# Patient Record
Sex: Male | Born: 1985 | Race: White | Hispanic: No | Marital: Married | Smoking: Never smoker
Health system: Southern US, Community
[De-identification: ages and names within clinical notes are randomized; demographics above are authoritative.]

## PROBLEM LIST (undated history)

## (undated) DIAGNOSIS — K8020 Calculus of gallbladder without cholecystitis without obstruction: Secondary | ICD-10-CM

## (undated) DIAGNOSIS — K219 Gastro-esophageal reflux disease without esophagitis: Secondary | ICD-10-CM

## (undated) DIAGNOSIS — R748 Abnormal levels of other serum enzymes: Secondary | ICD-10-CM

## (undated) HISTORY — PX: TONSILLECTOMY: SUR1361

## (undated) HISTORY — PX: TYMPANOSTOMY TUBE PLACEMENT: SHX32

---

## 2009-11-13 ENCOUNTER — Emergency Department (HOSPITAL_COMMUNITY): Admission: EM | Admit: 2009-11-13 | Discharge: 2009-11-13 | Payer: Self-pay | Admitting: Emergency Medicine

## 2013-12-18 HISTORY — PX: SURGERY SCROTAL / TESTICULAR: SUR1316

## 2014-10-30 ENCOUNTER — Other Ambulatory Visit: Payer: Self-pay | Admitting: Gastroenterology

## 2014-10-30 DIAGNOSIS — R109 Unspecified abdominal pain: Secondary | ICD-10-CM

## 2014-11-02 ENCOUNTER — Ambulatory Visit
Admission: RE | Admit: 2014-11-02 | Discharge: 2014-11-02 | Disposition: A | Discharge status: Home / Self Care | Payer: BC Managed Care – PPO | Source: Ambulatory Visit | Attending: Gastroenterology | Admitting: Gastroenterology

## 2014-11-02 ENCOUNTER — Ambulatory Visit (INDEPENDENT_AMBULATORY_CARE_PROVIDER_SITE_OTHER): Payer: Self-pay | Admitting: Surgery

## 2014-11-02 DIAGNOSIS — R109 Unspecified abdominal pain: Secondary | ICD-10-CM

## 2014-11-03 ENCOUNTER — Encounter (HOSPITAL_COMMUNITY)
Admission: RE | Admit: 2014-11-03 | Discharge: 2014-11-03 | Disposition: A | Discharge status: Home / Self Care | Payer: BC Managed Care – PPO | Source: Ambulatory Visit | Attending: Surgery | Admitting: Surgery

## 2014-11-03 ENCOUNTER — Inpatient Hospital Stay (HOSPITAL_COMMUNITY): Admission: RE | Admit: 2014-11-03 | Payer: BC Managed Care – PPO | Source: Ambulatory Visit

## 2014-11-03 ENCOUNTER — Encounter (HOSPITAL_COMMUNITY): Payer: Self-pay

## 2014-11-03 HISTORY — DX: Abnormal levels of other serum enzymes: R74.8

## 2014-11-03 HISTORY — DX: Gastro-esophageal reflux disease without esophagitis: K21.9

## 2014-11-03 HISTORY — DX: Calculus of gallbladder without cholecystitis without obstruction: K80.20

## 2014-11-03 LAB — COMPREHENSIVE METABOLIC PANEL
ALT: 329 U/L — AB (ref 0–53)
AST: 92 U/L — AB (ref 0–37)
Albumin: 4.5 g/dL (ref 3.5–5.2)
Alkaline Phosphatase: 161 U/L — ABNORMAL HIGH (ref 39–117)
Anion gap: 12 (ref 5–15)
BUN: 13 mg/dL (ref 6–23)
CALCIUM: 9.7 mg/dL (ref 8.4–10.5)
CO2: 27 meq/L (ref 19–32)
CREATININE: 0.95 mg/dL (ref 0.50–1.35)
Chloride: 101 mEq/L (ref 96–112)
GFR calc Af Amer: >90 mL/min (ref >90)
Glucose, Bld: 85 mg/dL (ref 70–99)
Potassium: 4.4 mEq/L (ref 3.7–5.3)
SODIUM: 140 meq/L (ref 137–147)
TOTAL PROTEIN: 7.8 g/dL (ref 6.0–8.3)
Total Bilirubin: 2.8 mg/dL — ABNORMAL HIGH (ref 0.3–1.2)

## 2014-11-03 LAB — CBC
HCT: 45.1 % (ref 39.0–52.0)
Hemoglobin: 16.4 g/dL (ref 13.0–17.0)
MCH: 34 pg (ref 26.0–34.0)
MCHC: 36.4 g/dL — AB (ref 30.0–36.0)
MCV: 93.6 fL (ref 78.0–100.0)
PLATELETS: 182 10*3/uL (ref 150–400)
RBC: 4.82 MIL/uL (ref 4.22–5.81)
RDW: 13 % (ref 11.5–15.5)
WBC: 6.2 10*3/uL (ref 4.0–10.5)

## 2014-11-03 NOTE — Patient Instructions (Addendum)
Darrol PokeBlake Lograsso  11/03/2014                           YOUR PROCEDURE IS SCHEDULED ON:  11/05/14                ENTER FROM [address] - GO TO PARKING DECK               LOOK FOR [employer]ING  / [employer]                              FOLLOW  SIGNS TO SHORT STAY CENTER                 ARRIVE AT SHORT STAY AT:   1:30 PM               CALL THIS NUMBER IF ANY PROBLEMS THE DAY OF SURGERY :               [phone]                                REMEMBER:   Do not eat food  AFTER MIDNIGHT              MAY HAVE CLEAR LIQUIDS UNTIL 9:30 AM     CLEAR LIQUID DIET   Foods Allowed                                                                     Foods Excluded  Coffee and tea, regular and decaf                             liquids that you cannot  Plain Jell-O in any flavor                                             see through such as: Fruit ices (not with fruit pulp)                                     milk, soups, orange juice  Iced Popsicles                                    All solid food Carbonated beverages, regular and diet                                    Cranberry, grape and apple juices Sports drinks like Gatorade Lightly seasoned clear broth or consume(fat free) Sugar, honey syrup                       _____________________________________________________________________  Take these medicines the morning of surgery with               A SIPS OF WATER :   OMEPRAZOLE                STOP ASPIRIN / HERBAL MEDS 5 DAYS PREOP    Do not wear jewelry, make-up   Do not wear lotions, powders, or perfumes.   Do not shave legs or underarms 12 hrs. before surgery (men may shave face)  Do not bring valuables to the hospital.  Contacts, dentures or bridgework may not be worn into surgery.  Leave suitcase in the car. After surgery it may be brought to your room.  For patients admitted to the hospital more than one night, checkout time is             11:00 AM                                                       The day of discharge.   Patients discharged the day of surgery will not be allowed to drive home.            If going home same day of surgery, must have someone stay with you              FIRST 24 hrs at home and arrange for some one to drive you              home from hospital.   ________________________________________________________________________                                                                                                  [place] - PREPARING FOR SURGERY  Before surgery, you can play an important role.  Because skin is not sterile, your skin needs to be as free of germs as possible.  You can reduce the number of germs on your skin by washing with CHG (chlorahexidine gluconate) soap before surgery.  CHG is an antiseptic cleaner which kills germs and bonds with the skin to continue killing germs even after washing. Please DO NOT use if you have an allergy to CHG or antibacterial soaps.  If your skin becomes reddened/irritated stop using the CHG and inform your nurse when you arrive at Short Stay. Do not shave (including legs and underarms) for at least 48 hours prior to the first CHG shower.  You may shave your face. Please follow these instructions carefully:   1.  Shower with CHG Soap the night before surgery and the  morning of Surgery.   2.  If you choose to wash your hair, wash your hair first as usual with your  normal  Shampoo.   3.  After you shampoo, rinse your hair and body thoroughly to remove the  shampoo.  4.  Use CHG as you would any other liquid soap.  You can apply chg directly  to the skin and wash . Gently wash with scrungie or clean wascloth    5.  Apply the CHG Soap to your body ONLY FROM THE NECK DOWN.   Do not use on open                           Wound or open sores. Avoid contact with eyes, ears mouth and genitals (private  parts).                        Genitals (private parts) with your normal soap.              6.  Wash thoroughly, paying special attention to the area where your surgery  will be performed.   7.  Thoroughly rinse your body with warm water from the neck down.   8.  DO NOT shower/wash with your normal soap after using and rinsing off  the CHG Soap .                9.  Pat yourself dry with a clean towel.             10.  Wear clean pajamas.             11.  Place clean sheets on your bed the night of your first shower and do not  sleep with pets.  Day of Surgery : Do not apply any lotions/deodorants the morning of surgery.  Please wear clean clothes to the hospital/surgery center.  FAILURE TO FOLLOW THESE INSTRUCTIONS MAY RESULT IN THE CANCELLATION OF YOUR SURGERY    PATIENT SIGNATURE_________________________________  ______________________________________________________________________

## 2014-11-03 NOTE — Progress Notes (Signed)
Quick Note:  These results are acceptable for scheduled surgery.  Brealyn Baril M. Yoshie Kosel, MD, FACS [facility] Office: [phone]   ______ 

## 2014-11-05 ENCOUNTER — Encounter (HOSPITAL_COMMUNITY)
Admission: RE | Disposition: A | Discharge status: Home / Self Care | Payer: Self-pay | Source: Ambulatory Visit | Attending: Surgery

## 2014-11-05 ENCOUNTER — Ambulatory Visit (HOSPITAL_COMMUNITY): Payer: BC Managed Care – PPO | Admitting: Anesthesiology

## 2014-11-05 ENCOUNTER — Encounter (HOSPITAL_COMMUNITY): Payer: Self-pay | Admitting: *Deleted

## 2014-11-05 ENCOUNTER — Ambulatory Visit (HOSPITAL_COMMUNITY): Payer: BC Managed Care – PPO

## 2014-11-05 ENCOUNTER — Inpatient Hospital Stay (HOSPITAL_COMMUNITY)
Admission: RE | Admit: 2014-11-05 | Discharge: 2014-11-06 | DRG: 419 | Disposition: A | Discharge status: Home / Self Care | Payer: BC Managed Care – PPO | Source: Ambulatory Visit | Attending: Surgery | Admitting: Surgery

## 2014-11-05 DIAGNOSIS — Z880 Allergy status to penicillin: Secondary | ICD-10-CM

## 2014-11-05 DIAGNOSIS — Z79899 Other long term (current) drug therapy: Secondary | ICD-10-CM

## 2014-11-05 DIAGNOSIS — K8065 Calculus of gallbladder and bile duct with chronic cholecystitis with obstruction: Principal | ICD-10-CM | POA: Diagnosis present

## 2014-11-05 DIAGNOSIS — K8021 Calculus of gallbladder without cholecystitis with obstruction: Secondary | ICD-10-CM | POA: Diagnosis present

## 2014-11-05 DIAGNOSIS — K819 Cholecystitis, unspecified: Secondary | ICD-10-CM

## 2014-11-05 DIAGNOSIS — R7989 Other specified abnormal findings of blood chemistry: Secondary | ICD-10-CM | POA: Diagnosis present

## 2014-11-05 DIAGNOSIS — K8010 Calculus of gallbladder with chronic cholecystitis without obstruction: Secondary | ICD-10-CM | POA: Diagnosis present

## 2014-11-05 DIAGNOSIS — K831 Obstruction of bile duct: Secondary | ICD-10-CM

## 2014-11-05 HISTORY — PX: CHOLECYSTECTOMY: SHX55

## 2014-11-05 SURGERY — LAPAROSCOPIC CHOLECYSTECTOMY WITH INTRAOPERATIVE CHOLANGIOGRAM
Anesthesia: General | Site: Abdomen

## 2014-11-05 MED ORDER — LACTATED RINGERS IV SOLN
INTRAVENOUS | Status: DC | PRN
  Administered 2014-11-05 (×2): via INTRAVENOUS

## 2014-11-05 MED ORDER — CISATRACURIUM BESYLATE (PF) 10 MG/5ML IV SOLN
INTRAVENOUS | Status: DC | PRN
  Administered 2014-11-05: 6 mg via INTRAVENOUS
  Administered 2014-11-05: 2 mg via INTRAVENOUS

## 2014-11-05 MED ORDER — GLYCOPYRROLATE 0.2 MG/ML IJ SOLN
INTRAMUSCULAR | Status: AC
Start: 2014-11-05 — End: 2014-11-05
  Filled 2014-11-05: qty 1

## 2014-11-05 MED ORDER — PROPOFOL 10 MG/ML IV BOLUS
INTRAVENOUS | Status: DC | PRN
  Administered 2014-11-05: 180 mg via INTRAVENOUS

## 2014-11-05 MED ORDER — LIDOCAINE HCL (CARDIAC) 20 MG/ML IV SOLN
INTRAVENOUS | Status: DC | PRN
  Administered 2014-11-05: 75 mg via INTRAVENOUS

## 2014-11-05 MED ORDER — HYDROMORPHONE HCL 1 MG/ML IJ SOLN
1.0000 mg | INTRAMUSCULAR | Status: DC | PRN
  Administered 2014-11-05 – 2014-11-06 (×6): 1 mg via INTRAVENOUS
  Filled 2014-11-05 (×6): qty 1

## 2014-11-05 MED ORDER — KCL IN DEXTROSE-NACL 20-5-0.45 MEQ/L-%-% IV SOLN
INTRAVENOUS | Status: DC
  Administered 2014-11-05: 20:00:00 via INTRAVENOUS
  Filled 2014-11-05 (×2): qty 1000

## 2014-11-05 MED ORDER — SUFENTANIL CITRATE 50 MCG/ML IV SOLN
INTRAVENOUS | Status: AC
  Filled 2014-11-05: qty 1

## 2014-11-05 MED ORDER — FENTANYL CITRATE 0.05 MG/ML IJ SOLN
INTRAMUSCULAR | Status: AC
  Filled 2014-11-05: qty 5

## 2014-11-05 MED ORDER — BUPIVACAINE HCL 0.25 % IJ SOLN
INTRAMUSCULAR | Status: DC | PRN
  Administered 2014-11-05: 22 mL

## 2014-11-05 MED ORDER — BUPIVACAINE HCL (PF) 0.25 % IJ SOLN
INTRAMUSCULAR | Status: AC
  Filled 2014-11-05: qty 30

## 2014-11-05 MED ORDER — DEXAMETHASONE SODIUM PHOSPHATE 10 MG/ML IJ SOLN
INTRAMUSCULAR | Status: DC | PRN
  Administered 2014-11-05: 10 mg via INTRAVENOUS

## 2014-11-05 MED ORDER — HYDROMORPHONE HCL 1 MG/ML IJ SOLN
0.2500 mg | INTRAMUSCULAR | Status: DC | PRN
  Administered 2014-11-05 (×2): 0.5 mg via INTRAVENOUS

## 2014-11-05 MED ORDER — DEXAMETHASONE SODIUM PHOSPHATE 10 MG/ML IJ SOLN
INTRAMUSCULAR | Status: AC
  Filled 2014-11-05: qty 1

## 2014-11-05 MED ORDER — LIDOCAINE HCL (CARDIAC) 20 MG/ML IV SOLN
INTRAVENOUS | Status: AC
  Filled 2014-11-05: qty 5

## 2014-11-05 MED ORDER — PROPOFOL 10 MG/ML IV BOLUS
INTRAVENOUS | Status: AC
  Filled 2014-11-05: qty 20

## 2014-11-05 MED ORDER — LACTATED RINGERS IV SOLN
INTRAVENOUS | Status: DC | PRN
  Administered 2014-11-05: 1000 mL via INTRAVENOUS

## 2014-11-05 MED ORDER — CIPROFLOXACIN IN D5W 400 MG/200ML IV SOLN
400.0000 mg | Freq: Once | INTRAVENOUS | Status: AC
  Administered 2014-11-05: 400 mg via INTRAVENOUS

## 2014-11-05 MED ORDER — HYDROMORPHONE HCL 1 MG/ML IJ SOLN
INTRAMUSCULAR | Status: AC
  Filled 2014-11-05: qty 1

## 2014-11-05 MED ORDER — ACETAMINOPHEN 325 MG PO TABS: 650.0000 mg | ORAL_TABLET | ORAL | Status: DC | PRN

## 2014-11-05 MED ORDER — CIPROFLOXACIN IN D5W 400 MG/200ML IV SOLN
INTRAVENOUS | Status: AC
  Filled 2014-11-05: qty 200

## 2014-11-05 MED ORDER — IOHEXOL 300 MG/ML  SOLN
INTRAMUSCULAR | Status: DC | PRN
  Administered 2014-11-05: 13 mL via INTRAVENOUS

## 2014-11-05 MED ORDER — FENTANYL CITRATE 0.05 MG/ML IJ SOLN
INTRAMUSCULAR | Status: DC | PRN
  Administered 2014-11-05 (×5): 50 ug via INTRAVENOUS

## 2014-11-05 MED ORDER — ONDANSETRON HCL 4 MG PO TABS
4.0000 mg | ORAL_TABLET | Freq: Four times a day (QID) | ORAL | Status: DC | PRN
Start: 2014-11-05 — End: 2014-11-06

## 2014-11-05 MED ORDER — LACTATED RINGERS IV SOLN: INTRAVENOUS | Status: DC

## 2014-11-05 MED ORDER — NEOSTIGMINE METHYLSULFATE 10 MG/10ML IV SOLN
INTRAVENOUS | Status: DC | PRN
  Administered 2014-11-05: 3 mg via INTRAVENOUS

## 2014-11-05 MED ORDER — ONDANSETRON HCL 4 MG/2ML IJ SOLN
INTRAMUSCULAR | Status: AC
  Filled 2014-11-05: qty 2

## 2014-11-05 MED ORDER — ONDANSETRON HCL 4 MG/2ML IJ SOLN
INTRAMUSCULAR | Status: DC | PRN
  Administered 2014-11-05: 4 mg via INTRAVENOUS

## 2014-11-05 MED ORDER — MIDAZOLAM HCL 5 MG/5ML IJ SOLN
INTRAMUSCULAR | Status: DC | PRN
  Administered 2014-11-05 (×2): 1 mg via INTRAVENOUS

## 2014-11-05 MED ORDER — CEFAZOLIN SODIUM-DEXTROSE 2-3 GM-% IV SOLR: 2.0000 g | INTRAVENOUS | Status: DC

## 2014-11-05 MED ORDER — HYDROCODONE-ACETAMINOPHEN 5-325 MG PO TABS
1.0000 | ORAL_TABLET | ORAL | Status: DC | PRN
  Administered 2014-11-06 (×2): 2 via ORAL
  Filled 2014-11-05 (×2): qty 2

## 2014-11-05 MED ORDER — SUCCINYLCHOLINE CHLORIDE 20 MG/ML IJ SOLN
INTRAMUSCULAR | Status: DC | PRN
  Administered 2014-11-05: 140 mg via INTRAVENOUS

## 2014-11-05 MED ORDER — ONDANSETRON HCL 4 MG/2ML IJ SOLN: 4.0000 mg | Freq: Four times a day (QID) | INTRAMUSCULAR | Status: DC | PRN

## 2014-11-05 MED ORDER — MIDAZOLAM HCL 2 MG/2ML IJ SOLN
INTRAMUSCULAR | Status: AC
  Filled 2014-11-05: qty 2

## 2014-11-05 MED ORDER — ROCURONIUM BROMIDE 100 MG/10ML IV SOLN
INTRAVENOUS | Status: AC
  Filled 2014-11-05: qty 1

## 2014-11-05 MED ORDER — GLYCOPYRROLATE 0.2 MG/ML IJ SOLN
INTRAMUSCULAR | Status: DC | PRN
  Administered 2014-11-05: 0.6 mg via INTRAVENOUS

## 2014-11-05 SURGICAL SUPPLY — 37 items
APPLIER CLIP ROT 10 11.4 M/L (STAPLE) ×3
BENZOIN TINCTURE PRP APPL 2/3 (GAUZE/BANDAGES/DRESSINGS) ×3 IMPLANT
CABLE HIGH FREQUENCY MONO STRZ (ELECTRODE) ×3 IMPLANT
CANISTER SUCTION 2500CC (MISCELLANEOUS) ×3 IMPLANT
CHLORAPREP W/TINT 26ML (MISCELLANEOUS) ×3 IMPLANT
CLIP APPLIE ROT 10 11.4 M/L (STAPLE) ×1 IMPLANT
CLOSURE WOUND 1/2 X4 (GAUZE/BANDAGES/DRESSINGS) ×1
COVER MAYO STAND STRL (DRAPES) ×3 IMPLANT
DECANTER SPIKE VIAL GLASS SM (MISCELLANEOUS) ×6 IMPLANT
DRAPE C-ARM 42X120 X-RAY (DRAPES) ×3 IMPLANT
DRAPE LAPAROSCOPIC ABDOMINAL (DRAPES) ×3 IMPLANT
DRAPE UTILITY XL STRL (DRAPES) ×3 IMPLANT
ELECT REM PT RETURN 9FT ADLT (ELECTROSURGICAL) ×3
ELECTRODE REM PT RTRN 9FT ADLT (ELECTROSURGICAL) ×1 IMPLANT
GAUZE SPONGE 2X2 8PLY STRL LF (GAUZE/BANDAGES/DRESSINGS) ×1 IMPLANT
GLOVE SURG ORTHO 8.0 STRL STRW (GLOVE) ×3 IMPLANT
GOWN STRL REUS W/TWL XL LVL3 (GOWN DISPOSABLE) ×9 IMPLANT
HEMOSTAT SURGICEL 4X8 (HEMOSTASIS) IMPLANT
KIT BASIN OR (CUSTOM PROCEDURE TRAY) ×3 IMPLANT
NS IRRIG 1000ML POUR BTL (IV SOLUTION) ×3 IMPLANT
POUCH SPECIMEN RETRIEVAL 10MM (ENDOMECHANICALS) ×3 IMPLANT
SCISSORS LAP 5X35 DISP (ENDOMECHANICALS) ×3 IMPLANT
SET CHOLANGIOGRAPH MIX (MISCELLANEOUS) ×3 IMPLANT
SET IRRIG TUBING LAPAROSCOPIC (IRRIGATION / IRRIGATOR) ×3 IMPLANT
SLEEVE XCEL OPT CAN 5 100 (ENDOMECHANICALS) ×3 IMPLANT
SOLUTION ANTI FOG 6CC (MISCELLANEOUS) ×3 IMPLANT
SPONGE GAUZE 2X2 STER 10/PKG (GAUZE/BANDAGES/DRESSINGS) ×2
STRIP CLOSURE SKIN 1/2X4 (GAUZE/BANDAGES/DRESSINGS) ×2 IMPLANT
SUT MNCRL AB 4-0 PS2 18 (SUTURE) ×3 IMPLANT
TAPE CLOTH SURG 4X10 WHT LF (GAUZE/BANDAGES/DRESSINGS) ×3 IMPLANT
TOWEL OR 17X26 10 PK STRL BLUE (TOWEL DISPOSABLE) ×3 IMPLANT
TOWEL OR NON WOVEN STRL DISP B (DISPOSABLE) ×3 IMPLANT
TRAY LAPAROSCOPIC (CUSTOM PROCEDURE TRAY) ×3 IMPLANT
TROCAR BLADELESS OPT 5 100 (ENDOMECHANICALS) ×3 IMPLANT
TROCAR XCEL BLUNT TIP 100MML (ENDOMECHANICALS) ×3 IMPLANT
TROCAR XCEL NON-BLD 11X100MML (ENDOMECHANICALS) ×3 IMPLANT
TUBING INSUFFLATION 10FT LAP (TUBING) ×3 IMPLANT

## 2014-11-05 NOTE — Op Note (Signed)
Procedure Note  Pre-operative Diagnosis:  Cholelithiasis, rule out choledocholithiasis  Post-operative Diagnosis:  Chronic cholecystitis, cholelithiasis  Surgeon:  Velora Hecklerodd M. Tauriel Scronce, MD, FACS  Assistant:  none   Procedure:  Laparoscopic cholecystectomy with intra-operative cholangiography  Anesthesia:  General  Estimated Blood Loss:  minimal  Drains: none         Specimen: Gallbladder to pathology  Indications:  28 yo WM with abdominal pain, USN demonstrates gallstones and sludge, abnormal LFT's.  Now for lap chole with IOC.  Procedure Details:  The patient was seen in the pre-op holding area. The risks, benefits, complications, treatment options, and expected outcomes were previously discussed with the patient. The patient agreed with the proposed plan and has signed the informed consent form.  The patient was brought to the Operating Room, identified as Christopher Saunders and the procedure verified as laparoscopic cholecystectomy with intraoperative cholangiography. A "time out" was completed and the above information confirmed.  The patient was placed in the supine position. Following induction of general anesthesia, the abdomen was prepped and draped in the usual aseptic fashion.  An incision was made in the skin near the umbilicus. The midline fascia was incised and the peritoneal cavity was entered and a Hasson canula was introduced under direct vision.  The Hasson canula was secured with a 0-Vicryl pursestring suture. Pneumoperitoneum was established with carbon dioxide. Additional trocars were introduced under direct vision along the right costal margin in the midline, mid-clavicular line, and anterior axillary line.   The gallbladder was identified and the fundus grasped and retracted cephalad. Adhesions were taken down bluntly and the electrocautery was utilized as needed, taking care not to injure any adjacent structures. The infundibulum was grasped and retracted laterally,  exposing the peritoneum overlying the triangle of Calot. The peritoneum was incised and structures exposed with blunt dissection. The cystic duct was clearly identified, bluntly dissected circumferentially, and clipped at the neck of the gallbladder.  An incision was made in the cystic duct and the cholangiogram catheter introduced. The catheter was secured using an ligaclip.  Real-time cholangiography was performed using C-arm fluoroscopy.  There was rapid filling of a normal caliber common bile duct.  There was reflux of contrast into the left and right hepatic ductal systems.  There was free flow distally into the duodenum without filling defect or obstruction.  Cholangiogram was reviewed by the radiologist who did not identify any filling defects in the CBD.  The catheter was removed from the peritoneal cavity.  The cystic duct was then ligated with surgical clips and divided. The cystic artery was identified, dissected circumferentially, ligated with ligaclips, and divided.  The gallbladder was dissected away from the liver bed using the electrocautery for hemostasis. The gallbladder was completely removed from the liver and placed into an endocatch bag. The gallbladder was removed in the endocatch bag through the umbilical port site and submitted to pathology for review.  The right upper quadrant was irrigated and the gallbladder bed was inspected. Hemostasis was achieved with the electrocautery.  Pneumoperitoneum was released after viewing removal of the trocars with good hemostasis noted. The umbilical wound was irrigated and the fascia was then closed with the pursestring suture.  Local anesthetic was infiltrated at all port sites. The skin incisions were closed with 4-0 Monocril subcuticular sutures and steri-strips and dressings were applied.  Instrument, sponge, and needle counts were correct at the conclusion of the case.  The patient was awakened from anesthesia and brought to the recovery  room in  stable condition.  The patient tolerated the procedure well.   Velora Hecklerodd M. Damontae Loppnow, MD, [facility] Office: [phone]

## 2014-11-05 NOTE — Anesthesia Procedure Notes (Signed)
Procedure Name: Intubation Date/Time: 11/05/2014 4:49 PM Performed by: Edison PaceGRAY, Chantale Leugers E Pre-anesthesia Checklist: Patient identified, Emergency Drugs available, Suction available, Patient being monitored and Timeout performed Patient Re-evaluated:Patient Re-evaluated prior to inductionOxygen Delivery Method: Circle system utilized Preoxygenation: Pre-oxygenation with 100% oxygen Intubation Type: IV induction and Cricoid Pressure applied Ventilation: Mask ventilation without difficulty Laryngoscope Size: Mac and 4 Grade View: Grade II Tube type: Oral Tube size: 7.5 mm Number of attempts: 1 Airway Equipment and Method: Stylet Placement Confirmation: ETT inserted through vocal cords under direct vision,  positive ETCO2 and breath sounds checked- equal and bilateral Secured at: 21 cm Tube secured with: Tape Dental Injury: Teeth and Oropharynx as per pre-operative assessment

## 2014-11-05 NOTE — Anesthesia Postprocedure Evaluation (Signed)
  Anesthesia Post-op Note  Patient: Christopher Saunders  Procedure(s) Performed: Procedure(s) (LRB): LAPAROSCOPIC CHOLECYSTECTOMY WITH INTRAOPERATIVE CHOLANGIOGRAM (N/A)  Patient Location: PACU  Anesthesia Type: General  Level of Consciousness: awake and alert   Airway and Oxygen Therapy: Patient Spontanous Breathing  Post-op Pain: mild  Post-op Assessment: Post-op Vital signs reviewed, Patient's Cardiovascular Status Stable, Respiratory Function Stable, Patent Airway and No signs of Nausea or vomiting  Last Vitals:  Filed Vitals:   11/05/14 1840  BP:   Pulse: 75  Temp:   Resp: 11    Post-op Vital Signs: stable   Complications: No apparent anesthesia complications

## 2014-11-05 NOTE — Anesthesia Preprocedure Evaluation (Addendum)
Anesthesia Evaluation  Patient identified by MRN, date of birth, ID band Patient awake    Reviewed: Allergy & Precautions, H&P , NPO status , Patient's Chart, lab work & pertinent test results  Airway Mallampati: II  TM Distance: >3 FB Neck ROM: full    Dental no notable dental hx. (+) Teeth Intact, Dental Advisory Given   Pulmonary neg pulmonary ROS,  breath sounds clear to auscultation  Pulmonary exam normal       Cardiovascular Exercise Tolerance: Good negative cardio ROS  Rhythm:regular Rate:Normal     Neuro/Psych negative neurological ROS  negative psych ROS   GI/Hepatic negative GI ROS, Neg liver ROS,   Endo/Other  negative endocrine ROS  Renal/GU negative Renal ROS  negative genitourinary   Musculoskeletal   Abdominal   Peds  Hematology negative hematology ROS (+)   Anesthesia Other Findings   Reproductive/Obstetrics negative OB ROS                             Anesthesia Physical Anesthesia Plan  ASA: I  Anesthesia Plan: General   Post-op Pain Management:    Induction:   Airway Management Planned: Oral ETT  Additional Equipment:   Intra-op Plan:   Post-operative Plan: Extubation in OR  Informed Consent: I have reviewed the patients History and Physical, chart, labs and discussed the procedure including the risks, benefits and alternatives for the proposed anesthesia with the patient or authorized representative who has indicated his/her understanding and acceptance.   Dental Advisory Given  Plan Discussed with: CRNA and Surgeon  Anesthesia Plan Comments:        Anesthesia Quick Evaluation

## 2014-11-05 NOTE — Progress Notes (Signed)
Received phone call from Dr Gerrit FriendsGerkin, T to enter NPO order and to inform patient that Dr Ancil LinseyMega will be in tomorrow morning at 0730 to see patient.

## 2014-11-05 NOTE — H&P (Signed)
General Surgery [facility]  Darrol PokeBlake Scicchitano 11/02/2014 4:26 PM Location: [facility] Patient #: [id number] DOB: 04-Mar-1986 Married / Language: [place] / Race: White Male  History of Present Illness Velora Heckler(Nayeli Calvert M. Madalee Altmann MD; 11/02/2014 5:01 PM) The patient is a 28 year old male who presents for evaluation of gall stones. Patient is referred by Dr. Vida RiggerMarc Magod for evaluation of cholelithiasis and suspected choledocholithiasis. Patient experienced his first episode of epigastric abdominal pain radiating to the back approximately 6 weeks ago. He has had 3 episodes included an episode last week. Patient was evaluated by his gastroenterologist and found to have markedly abnormal liver function test with a total bilirubin of 13.6 and elevated transaminases. Lipase level was normal. Ultrasound demonstrated sludge and multiple small gallstones. Common bile duct was slightly dilated at 7 mm. Patient improved symptomatically. Repeat laboratory studies performed today show a total bilirubin of 2.6, and AST of 86, and ALT of 338, and an alkaline phosphatase of 148. Patient presents today for evaluation. He is having minimal epigastric abdominal discomfort. He has been tolerating a light diet. He denies fevers or chills. Patient has had no prior history of hepatobiliary or pancreatic disease. He has had no prior abdominal surgery. There is no family history of cholelithiasis.   Other Problems Gilmer Mor(Sonya Bynum, CMA; 11/02/2014 4:27 PM) Cholelithiasis  Diagnostic Studies History Gilmer Mor(Sonya Bynum, CMA; 11/02/2014 4:27 PM) Colonoscopy never  Allergies (Sonya Bynum, CMA; 11/02/2014 4:27 PM) Amoxicillin *PENICILLINS*  Medication History (Sonya Bynum, CMA; 11/02/2014 4:27 PM) Omeprazole (Oral) Specific dose unknown - Active. Singulair (Oral) Specific dose unknown - Active.  Social History Gilmer Mor(Sonya Bynum, CMA; 11/02/2014 4:27 PM) Alcohol use Occasional alcohol use. Caffeine  use Coffee. No drug use Tobacco use Never smoker.  Family History Gilmer Mor(Sonya Bynum, CMA; 11/02/2014 4:27 PM) Alcohol Abuse Mother.  Review of Systems Lamar Laundry(Sonya Bynum CMA; 11/02/2014 4:27 PM) General Not Present- Appetite Loss, Chills, Fatigue, Fever, Night Sweats, Weight Gain and Weight Loss. Skin Not Present- Change in Wart/Mole, Dryness, Hives, Jaundice, New Lesions, Non-Healing Wounds, Rash and Ulcer. HEENT Not Present- Earache, Hearing Loss, Hoarseness, Nose Bleed, Oral Ulcers, Ringing in the Ears, Seasonal Allergies, Sinus Pain, Sore Throat, Visual Disturbances, Wears glasses/contact lenses and Yellow Eyes. Respiratory Not Present- Bloody sputum, Chronic Cough, Difficulty Breathing, Snoring and Wheezing. Breast Not Present- Breast Mass, Breast Pain, Nipple Discharge and Skin Changes. Cardiovascular Not Present- Chest Pain, Difficulty Breathing Lying Down, Leg Cramps, Palpitations, Rapid Heart Rate, Shortness of Breath and Swelling of Extremities. Gastrointestinal Not Present- Abdominal Pain, Bloating, Bloody Stool, Change in Bowel Habits, Chronic diarrhea, Constipation, Difficulty Swallowing, Excessive gas, Gets full quickly at meals, Hemorrhoids, Indigestion, Nausea, Rectal Pain and Vomiting. Male Genitourinary Not Present- Blood in Urine, Change in Urinary Stream, Frequency, Impotence, Nocturia, Painful Urination, Urgency and Urine Leakage. Musculoskeletal Not Present- Back Pain, Joint Pain, Joint Stiffness, Muscle Pain, Muscle Weakness and Swelling of Extremities. Neurological Not Present- Decreased Memory, Fainting, Headaches, Numbness, Seizures, Tingling, Tremor, Trouble walking and Weakness. Psychiatric Not Present- Anxiety, Bipolar, Change in Sleep Pattern, Depression, Fearful and Frequent crying. Endocrine Not Present- Cold Intolerance, Excessive Hunger, Hair Changes, Heat Intolerance, Hot flashes and New Diabetes. Hematology Not Present- Easy Bruising, Excessive bleeding, Gland  problems, HIV and Persistent Infections.   Vitals (Sonya Bynum CMA; 11/02/2014 4:28 PM) 11/02/2014 4:27 PM Weight: 189 lb Height: 67in Body Surface Area: 2.01 m Body Mass Index: 29.6 kg/m Pulse: 81 (Regular)  BP: 124/70 (Sitting, Left Arm, Standard)    Physical Exam Velora Heckler(Lorien Shingler M. Ahsha Hinsley  MD; 11/02/2014 5:01 PM) The physical exam findings are as follows: Note:General - appears comfortable, no distress; not diaphorectic  HEENT - normocephalic; sclerae clear, gaze conjugate; mucous membranes moist, dentition good; voice normal  Neck - symmetric on extension; no palpable anterior or posterior cervical adenopathy; no palpable masses in the thyroid bed  Chest - clear bilaterally with rhonchi, rales, or wheeze  Cor - regular rhythm with normal rate; no significant murmur  Abd - soft without distension, mild epigastric tenderness  Ext - non-tender without significant edema or lymphedema  Neuro - grossly intact; no tremor    Assessment & Plan Velora Heckler(Anmarie Fukushima M. Shell Yandow MD; 11/02/2014 5:07 PM) CHOLELITHIASIS WITH CHOLEDOCHOLITHIASIS (574.90  K80.70) Current Plans  Instructed to keep follow-up appointment as scheduled   I discussed the above findings at length with the patient and his mother. I provided him with written literature on gallbladder surgery to review at home. I discussed his case by telephone with Dr. Vida RiggerMarc Magod, his gastroenterologist.  The patient has symptomatic cholelithiasis and has had at least one episode of choledocholithiasis. His laboratory studies are currently improving but he may have retained common bile duct stones. We discussed options for management including obtaining an MRCP scan, proceeding with ERCP, or proceeding with laparoscopic cholecystectomy with intraoperative cholangiography.  I recommended proceeding with laparoscopic cholecystectomy with intraoperative cholangiography. We have discussed the risk and benefits of the procedure. We have discussed  the potential for conversion to open surgery. We have discussed the hospital stay and the postoperative recovery to be anticipated. The patient understands and wishes to proceed with surgery as soon as possible.  If there are retained common bile duct stones on intraoperative cholangiogram, the patient will require ERCP. I will coordinate this with his gastroenterologist.  Patient will be scheduled for surgery later this week.  The risks and benefits of the procedure have been discussed at length with the patient. The patient understands the proposed procedure, potential alternative treatments, and the course of recovery to be expected. All of the patient's questions have been answered at this time. The patient wishes to proceed with surgery.  Velora Hecklerodd M. Mischa Pollard, MD, [facility] Office: [phone]

## 2014-11-05 NOTE — Transfer of Care (Signed)
Immediate Anesthesia Transfer of Care Note  Patient: Christopher Saunders  Procedure(s) Performed: Procedure(s): LAPAROSCOPIC CHOLECYSTECTOMY WITH INTRAOPERATIVE CHOLANGIOGRAM (N/A)  Patient Location: PACU  Anesthesia Type:General  Level of Consciousness: awake, alert , oriented and patient cooperative  Airway & Oxygen Therapy: Patient Spontanous Breathing and Patient connected to face mask oxygen  Post-op Assessment: Report given to PACU RN, Post -op Vital signs reviewed and stable and Patient moving all extremities  Post vital signs: Reviewed and stable  Complications: No apparent anesthesia complications

## 2014-11-06 ENCOUNTER — Other Ambulatory Visit: Payer: Self-pay

## 2014-11-06 ENCOUNTER — Ambulatory Visit (HOSPITAL_COMMUNITY)
Admission: RE | Admit: 2014-11-06 | Payer: BC Managed Care – PPO | Source: Ambulatory Visit | Admitting: Gastroenterology

## 2014-11-06 ENCOUNTER — Inpatient Hospital Stay (HOSPITAL_COMMUNITY): Payer: BC Managed Care – PPO

## 2014-11-06 ENCOUNTER — Observation Stay (HOSPITAL_COMMUNITY): Payer: BC Managed Care – PPO | Admitting: Certified Registered"

## 2014-11-06 ENCOUNTER — Encounter (HOSPITAL_COMMUNITY)
Admission: RE | Disposition: A | Discharge status: Home / Self Care | Payer: Self-pay | Source: Ambulatory Visit | Attending: Surgery

## 2014-11-06 ENCOUNTER — Encounter (HOSPITAL_COMMUNITY): Payer: Self-pay | Admitting: Gastroenterology

## 2014-11-06 DIAGNOSIS — K8065 Calculus of gallbladder and bile duct with chronic cholecystitis with obstruction: Secondary | ICD-10-CM | POA: Diagnosis present

## 2014-11-06 DIAGNOSIS — Z880 Allergy status to penicillin: Secondary | ICD-10-CM | POA: Diagnosis not present

## 2014-11-06 DIAGNOSIS — Z79899 Other long term (current) drug therapy: Secondary | ICD-10-CM | POA: Diagnosis not present

## 2014-11-06 DIAGNOSIS — R7989 Other specified abnormal findings of blood chemistry: Secondary | ICD-10-CM | POA: Diagnosis present

## 2014-11-06 DIAGNOSIS — R1013 Epigastric pain: Secondary | ICD-10-CM | POA: Diagnosis present

## 2014-11-06 HISTORY — PX: ERCP: SHX5425

## 2014-11-06 LAB — COMPREHENSIVE METABOLIC PANEL
ALT: 284 U/L — ABNORMAL HIGH (ref 0–53)
AST: 115 U/L — AB (ref 0–37)
Albumin: 4.1 g/dL (ref 3.5–5.2)
Alkaline Phosphatase: 126 U/L — ABNORMAL HIGH (ref 39–117)
Anion gap: 13 (ref 5–15)
BILIRUBIN TOTAL: 3.2 mg/dL — AB (ref 0.3–1.2)
BUN: 11 mg/dL (ref 6–23)
CHLORIDE: 98 meq/L (ref 96–112)
CO2: 25 meq/L (ref 19–32)
CREATININE: 0.82 mg/dL (ref 0.50–1.35)
Calcium: 9.2 mg/dL (ref 8.4–10.5)
GFR calc Af Amer: >90 mL/min (ref >90)
Glucose, Bld: 194 mg/dL — ABNORMAL HIGH (ref 70–99)
Potassium: 4.3 mEq/L (ref 3.7–5.3)
Sodium: 136 mEq/L — ABNORMAL LOW (ref 137–147)
Total Protein: 6.7 g/dL (ref 6.0–8.3)

## 2014-11-06 SURGERY — ERCP, WITH INTERVENTION IF INDICATED
Anesthesia: General

## 2014-11-06 MED ORDER — ONDANSETRON HCL 4 MG/2ML IJ SOLN
INTRAMUSCULAR | Status: DC | PRN
  Administered 2014-11-06: 4 mg via INTRAVENOUS

## 2014-11-06 MED ORDER — PROPOFOL 10 MG/ML IV BOLUS
INTRAVENOUS | Status: AC
  Filled 2014-11-06: qty 20

## 2014-11-06 MED ORDER — CETYLPYRIDINIUM CHLORIDE 0.05 % MT LIQD: 7.0000 mL | OROMUCOSAL | Status: DC | PRN

## 2014-11-06 MED ORDER — FENTANYL CITRATE 0.05 MG/ML IJ SOLN
INTRAMUSCULAR | Status: DC | PRN
  Administered 2014-11-06 (×2): 50 ug via INTRAVENOUS

## 2014-11-06 MED ORDER — HYDROCODONE-ACETAMINOPHEN 5-325 MG PO TABS: 1.0000 | ORAL_TABLET | ORAL | Status: AC | PRN

## 2014-11-06 MED ORDER — GLUCAGON HCL RDNA (DIAGNOSTIC) 1 MG IJ SOLR
INTRAMUSCULAR | Status: AC
  Filled 2014-11-06: qty 2

## 2014-11-06 MED ORDER — SODIUM CHLORIDE 0.9 % IV SOLN: INTRAVENOUS | Status: DC

## 2014-11-06 MED ORDER — ONDANSETRON HCL 4 MG/2ML IJ SOLN
INTRAMUSCULAR | Status: AC
Start: 2014-11-06 — End: 2014-11-06
  Filled 2014-11-06: qty 2

## 2014-11-06 MED ORDER — DEXAMETHASONE SODIUM PHOSPHATE 10 MG/ML IJ SOLN
INTRAMUSCULAR | Status: AC
  Filled 2014-11-06: qty 1

## 2014-11-06 MED ORDER — MIDAZOLAM HCL 2 MG/2ML IJ SOLN
INTRAMUSCULAR | Status: AC
  Filled 2014-11-06: qty 2

## 2014-11-06 MED ORDER — CIPROFLOXACIN IN D5W 400 MG/200ML IV SOLN
INTRAVENOUS | Status: AC
  Filled 2014-11-06: qty 200

## 2014-11-06 MED ORDER — FENTANYL CITRATE 0.05 MG/ML IJ SOLN
INTRAMUSCULAR | Status: AC
  Filled 2014-11-06: qty 2

## 2014-11-06 MED ORDER — SUCCINYLCHOLINE CHLORIDE 20 MG/ML IJ SOLN
INTRAMUSCULAR | Status: DC | PRN
  Administered 2014-11-06: 100 mg via INTRAVENOUS

## 2014-11-06 MED ORDER — DEXAMETHASONE SODIUM PHOSPHATE 10 MG/ML IJ SOLN
INTRAMUSCULAR | Status: DC | PRN
  Administered 2014-11-06: 10 mg via INTRAVENOUS

## 2014-11-06 MED ORDER — CIPROFLOXACIN IN D5W 400 MG/200ML IV SOLN
INTRAVENOUS | Status: DC | PRN
  Administered 2014-11-06: 400 mg via INTRAVENOUS

## 2014-11-06 MED ORDER — MIDAZOLAM HCL 5 MG/5ML IJ SOLN
INTRAMUSCULAR | Status: DC | PRN
  Administered 2014-11-06: 2 mg via INTRAVENOUS

## 2014-11-06 MED ORDER — LACTATED RINGERS IV SOLN
INTRAVENOUS | Status: DC | PRN
  Administered 2014-11-06: 12:00:00 via INTRAVENOUS

## 2014-11-06 MED ORDER — SODIUM CHLORIDE 0.9 % IV SOLN
INTRAVENOUS | Status: DC | PRN
  Administered 2014-11-06: 20 mL

## 2014-11-06 MED ORDER — PROPOFOL 10 MG/ML IV BOLUS
INTRAVENOUS | Status: DC | PRN
  Administered 2014-11-06: 150 mg via INTRAVENOUS

## 2014-11-06 NOTE — Anesthesia Preprocedure Evaluation (Signed)

## 2014-11-06 NOTE — Plan of Care (Signed)
Problem: Phase I Progression Outcomes Goal: OOB as tolerated unless otherwise ordered Outcome: Progressing  Comments:  Patient walking in room and to bathroom.

## 2014-11-06 NOTE — Discharge Summary (Signed)
Physician Discharge Summary [facility]  Patient ID: Christopher Saunders MRN: [id number] DOB/AGE: [birth year] 28 y.o.  Admit date: 11/05/2014 Discharge date: 11/06/2014  Admission Diagnoses:  Chronic cholecystitis, cholelithiasis, choledocholithiasis  Discharge Diagnoses:  Principal Problem:   Cholelithiasis with biliary obstruction Active Problems:   Cholelithiasis with cholecystitis   Discharged Condition: good  Hospital Course: patient admitted for lap chole with IOC due to intermittent abdominal pain, USN showing cholelithiasis and sludge, and abnormal LFT's.  Patient underwent lap chole with IOC on 11/05/2014 without complication.  IOC was clear without obvious filling defects.  However, LFT's on morning following surgery with persistent elevation.  Consultation with Dr. Ewing SchleinMagod from gastroenterology obtained.  ERCP with sphicterotomy performed on 11/06/2014 with few small tiny stones from CBD.  Patient did well following procedure, pain controlled, and tolerated diet.  Discharged home at patient request.  Consults: GI  Treatments: surgery: lap cholecystectomy with IOC; ERCP with sphicterotomy  Discharge Exam: Blood pressure 116/53, pulse 68, temperature 99.2 F (37.3 C), temperature source Oral, resp. rate 16, height 5\' 8"  (1.727 m), weight 154 lb (69.854 kg), SpO2 99 %. HEENT - clear Neck - soft Chest - clear bilaterally Cor - RRR Abd - soft without distension; dressings intact and dry  Disposition: Home  Discharge Instructions    Diet - low sodium heart healthy    Complete by:  As directed      Discharge instructions    Complete by:  As directed   [facility]  LAPAROSCOPIC SURGERY - POST-OP INSTRUCTIONS  Always review your discharge instruction sheet given to you by the facility where your surgery was performed.  A prescription for pain medication may be given to you upon discharge.  Take your pain medication as prescribed.  If  narcotic pain medicine is not needed, then you may take acetaminophen (Tylenol) or ibuprofen (Advil) as needed.  Take your usually prescribed medications unless otherwise directed.  If you need a refill on your pain medication, please contact your pharmacy.  They will contact our office to request authorization. Prescriptions will not be filled after 5 P.M. or on weekends.  You should follow a light diet the first few days after arrival home, such as soup and crackers or toast.  Be sure to include plenty of fluids daily.  Most patients will experience some swelling and bruising in the area of the incisions.  Ice packs will help.  Swelling and bruising can take several days to resolve.   It is common to experience some constipation if taking pain medication after surgery.  Increasing fluid intake and taking a stool softener (such as Colace) will usually help or prevent this problem from occurring.  A mild laxative (Milk of Magnesia or Miralax) should be taken according to package instructions if there are no bowel movements after 48 hours.  Unless discharge instructions indicate otherwise, you may remove your bandages 24-48 hours after surgery, and you may shower at that time.  You may have steri-strips (small skin tapes) in place directly over the incision.  These strips should be left on the skin for 7-10 days.  If your surgeon used skin glue on the incision, you may shower in 24 hours.  The glue will flake off over the next 2-3 weeks.  Any sutures or staples will be removed at the office during your follow-up visit.  ACTIVITIES:  You may resume regular (light) daily activities beginning the next day-such as daily self-care, walking, climbing stairs-gradually increasing activities  as tolerated.  You may have sexual intercourse when it is comfortable.  Refrain from any heavy lifting or straining until approved by your doctor.  You may drive when you are no longer taking prescription pain medication,  you can comfortably wear a seatbelt, and you can safely maneuver your car and apply brakes.  You should see your doctor in the office for a follow-up appointment approximately 2-3 weeks after your surgery.  Make sure that you call for this appointment within a day or two after you arrive home to insure a convenient appointment time.  WHEN TO CALL YOUR DOCTOR: Fever over 101.0 Inability to urinate Continued bleeding from incision Increased pain, redness, or drainage from the incision Increasing abdominal pain  The clinic staff is available to answer your questions during regular business hours.  Please don't hesitate to call and ask to speak to one of the nurses for clinical concerns.  If you have a medical emergency, go to the nearest emergency room or call 911.  A [facility] from [facility] is always on call for the hospital.  Velora Hecklerodd M. Meloney Feld, MD, [facility] Office: [phone] Toll Free:  [phone] FAX [phone]  Web site: [email or web address]     Increase activity slowly    Complete by:  As directed      Remove dressing in 24 hours    Complete by:  As directed             Medication List    TAKE these medications        HYDROcodone-acetaminophen 5-325 MG per tablet  Commonly known as:  NORCO/VICODIN  Take 1-2 tablets by mouth every 4 (four) hours as needed for moderate pain.     ibuprofen 200 MG tablet  Commonly known as:  ADVIL,MOTRIN  Take 400 mg by mouth every 6 (six) hours as needed for mild pain.     omeprazole 20 MG tablet  Commonly known as:  PRILOSEC OTC  Take 20 mg by mouth daily.           Follow-up Information    Follow up with Velora HecklerGERKIN,Azizah Lisle M, MD. Schedule an appointment as soon as possible for a visit in 3 weeks.   Specialty:  General Surgery   Why:  For wound re-check   Contact information:   [address] [place] [address] [phone]       Velora Hecklerodd M. Keiosha Cancro, MD,  [facility] Office: [phone]   Signed: Velora HecklerGERKIN,Darrly Loberg M 11/06/2014, 8:32 PM

## 2014-11-06 NOTE — Op Note (Signed)
[facility] [address] [place], [id number]   ERCP PROCEDURE REPORT  PATIENT: Christopher Saunders, [name]  MR# :[id number] BIRTHDATE: Mar 11, 1986  GENDER: male ENDOSCOPIST: Vida RiggerMarc Nazaire Cordial, MD REFERRED BY: Darnell Levelodd Gerkin, M.D. PROCEDURE DATE:  11/06/2014 PROCEDURE:   ERCP with sphincterotomy/papillotomy and ERCP with removal of calculus/calculi  with balloon pull-through ASA CLASS:    1 INDICATIONS: possible CBD stonesMEDICATIONS:    general anesthesia TOPICAL ANESTHETIC:  none  DESCRIPTION OF PROCEDURE:   After the risks benefits and alternatives of the procedure were thoroughly explained, informed consent was obtained.  The Pentax ERCP [id number]  endoscope was introduced through the mouth and advanced to the second portion of the duodenum .a normal-appearing ampulla was brought into view and on the first attempt using the triple lumen sphincterotome loaded with the JAG Jagwire deep selective cannulation was obtained and the wire was advanced into the intrahepatics and dye was injected and no obvious stone was seen on initial cholangiogram but because we thought he had tiny sand-like stones went ahead and proceeded with a sphincterotomy in the customary fashion until we had adequate biliary drainage and could get the fully bowed sphincterotome easily in and out of the duct and then we proceeded with multiple 12 mm balloon pull-through's using the adjustable balloon and there was minimal resistance on the first pull-through but not much on subsequent ones and a few tiny flecks of stone material was seen and we then proceeded with an occlusion cholangiogram which was normal and the balloon was pulled one more time through the sphincterotomy site and there was adequate biliary drainage and the patient tolerated the procedure well there was no obvious immediate complication and no pancreatic duct injection or wire advancement throughout the  procedure            COMPLICATIONS: none ENDOSCOPIC IMPRESSION:1. Normal ampulla 2. Normal CBD status post sphincterotomy and balloon pull-through with only a few flecks of stone debris were removed 3. Adequate biliary drainage at the end of the procedure 4. No pancreatic duct wire advancement or injection throughout the procedure  RECOMMENDATIONS:customary post-ERCP orders if tolerates clear liquids at 6 PM may go home at 7 PM if doing well and his discharge instructions were discussed including call me or Dr. Gerrit Friendsgerkin this weekend and call us if needed sooner otherwise repeat liver tests when  he has surgical follow-up in a few weeks    _______________________________ eSigned:  Vida RiggerMarc Amarrion Pastorino, MD 11/06/2014 1:52 PM   QI:ONGECC:Todd Gerrit FriendsGerkin, MD

## 2014-11-06 NOTE — Plan of Care (Signed)
Problem: Phase I Progression Outcomes Goal: Voiding-avoid urinary catheter unless indicated Outcome: Completed/Met Date Met:  11/06/14

## 2014-11-06 NOTE — Progress Notes (Signed)
Patient ID: Christopher Saunders, male   DOB: November 29, 1986, 28 y.o.   MRN: [id number]  [facility]  POD# 1  Subjective: Patient back in room after ERCP today by Dr. Ewing SchleinMagod.  Discussed lab work earlier today.  Discussed ERCP decision with Dr. Ewing SchleinMagod this AM.  Patient doing well.  Wife at bedside.  Hoping to go home later today after eating.  Objective: Vital signs in last 24 hours: Temp:  [97.8 F (36.6 C)-99.2 F (37.3 C)] 99.2 F (37.3 C) (11/20 0528) Pulse Rate:  [64-79] 68 (11/20 1415) Resp:  [11-18] 16 (11/20 1415) BP: (114-140)/(53-94) 116/53 mmHg (11/20 1410) SpO2:  [97 %-100 %] 99 % (11/20 1415) Last BM Date:  (prior to admission)  Intake/Output from previous day: 11/19 0701 - 11/20 0700 In: 1757.5 [I.V.:1757.5] Out: 2625 [Urine:2625]  Exam: HEENT - clear, not icteric Neck - soft Abd - dressings dry and intact; no distension Ext - no significant edema Neuro - grossly intact, no focal deficits  Lab Results:  No results for input(s): WBC, HGB, HCT, PLT in the last 72 hours.   Recent Labs  11/06/14 0521  NA 136*  K 4.3  CL 98  CO2 25  GLUCOSE 194*  BUN 11  CREATININE 0.82  CALCIUM 9.2    Studies/Results: Dg Cholangiogram Operative  11/05/2014   CLINICAL DATA:  Intraoperative cholangiogram, history of possible recently passed common bile duct stones  EXAM: INTRAOPERATIVE CHOLANGIOGRAM  TECHNIQUE: Cholangiographic images from the C-arm fluoroscopic device were submitted for interpretation post-operatively. Please see the procedural report for the amount of contrast and the fluoroscopy time utilized.  COMPARISON:  11/02/2014  FINDINGS: Free flow contrast material is noted into the common bile duct opacification of the intrahepatic and extrahepatic biliary tree is noted. No filling defects are identified. Free flow contrast into the duodenum is noted.  FLUOROSCOPY TIME:  21 seconds  IMPRESSION: No evidence of retained  common bile duct stone.  These results were called by telephone at the time of interpretation on 11/05/2014 at 5:58 pm to Dr. Darnell LevelDD Keyion Knack , who verbally acknowledged these results.   Electronically Signed   By: Alcide CleverMark  Lukens M.D.   On: 11/05/2014 17:58   Dg Ercp Biliary & Pancreatic Ducts  11/06/2014   CLINICAL DATA:  28 year old male with a history of cholelithiasis and concern for small stones and sludge in the common bile duct.  EXAM: ERCP sphincterotomy and balloon sweep of the common bile duct  TECHNIQUE: Multiple spot images obtained with the fluoroscopic device and submitted for interpretation post-procedure.  COMPARISON:  Intraoperative cholangiogram 11/05/2014; abdominal ultrasound 11/02/2014  FINDINGS: A total of 3 spot images and a single cine loop were obtained during ERCP procedure. Initial images demonstrate a flexible endoscope in the duodenum with wire cannulation into the intrahepatic biliary tree. Subsequently, sphincterotomy was performed followed by balloon sweep. Air bubbles are identified within the distal common bile duct. No definite stone visualized. However, the GI operative report describes several small flecks of stone were successfully removed. No evidence of complication.  IMPRESSION: ERCP and sphincterotomy and balloon sweep of the common bile duct as above.  These images were submitted for radiologic interpretation only. Please see the procedural report for the amount of contrast and the fluoroscopy time utilized.   Electronically Signed   By: Malachy MoanHeath  McCullough M.D.   On: 11/06/2014 14:54    Assessment / Plan: 1.  Chronic cholecystitis, cholelithiasis 2.  Abnormal LFT's, choledocholithiasis  Patient is doing well today following ERCP.  Will allow po diet and discharge early this evening if tolerated.  Discussed with Dr. Ewing SchleinMagod who agrees with plans.  Velora Hecklerodd M. Arling Cerone, MD, [facility] Office: [phone]  11/06/2014

## 2014-11-06 NOTE — Plan of Care (Signed)
Problem: Phase I Progression Outcomes Goal: Pain controlled with appropriate interventions Outcome: Completed/Met Date Met:  11/06/14     

## 2014-11-06 NOTE — Anesthesia Postprocedure Evaluation (Signed)
  Anesthesia Post-op Note  Patient: Christopher Saunders  Procedure(s) Performed: Procedure(s) (LRB): ENDOSCOPIC RETROGRADE CHOLANGIOPANCREATOGRAPHY (ERCP) (N/A)  Patient Location: PACU  Anesthesia Type: General  Level of Consciousness: awake and alert   Airway and Oxygen Therapy: Patient Spontanous Breathing  Post-op Pain: mild  Post-op Assessment: Post-op Vital signs reviewed, Patient's Cardiovascular Status Stable, Respiratory Function Stable, Patent Airway and No signs of Nausea or vomiting  Last Vitals:  Filed Vitals:   11/06/14 1357  BP: 126/73  Pulse: 76  Temp:   Resp: 17    Post-op Vital Signs: stable   Complications: No apparent anesthesia complications

## 2014-11-06 NOTE — Transfer of Care (Signed)
Immediate Anesthesia Transfer of Care Note  Patient: Christopher Saunders  Procedure(s) Performed: Procedure(s) (LRB): ENDOSCOPIC RETROGRADE CHOLANGIOPANCREATOGRAPHY (ERCP) (N/A)  Patient Location: PACU  Anesthesia Type: General  Level of Consciousness: sedated, patient cooperative and responds to stimulation  Airway & Oxygen Therapy: Patient Spontanous Breathing and Patient connected to face mask oxgen  Post-op Assessment: Report given to PACU RN and Post -op Vital signs reviewed and stable  Post vital signs: Reviewed and stable  Complications: No apparent anesthesia complications

## 2014-11-06 NOTE — Progress Notes (Signed)
Darrol PokeBlake Statzer 8:57 AM  Subjective: Patient is doing fine postop but still feels like he has had same right upper quadrant discomfort but no new complaints are findings discussed with Dr. Gerrit Friendsgerkin yesterday pertinent for probable sand and an sludge in gallbladder  Objective: Vital signs stable afebrile patient looks well not examined this morning Intra-Op cholangiogram reviewed agree looks good some liver tests increased some decreased Assessment: Questionable residual CBD stones  Plan: I'm concerned about his liver test still being up and the fact that he had lots of sand or gravel in his gallbladder and I am concerned he might have ongoing attacks without a sphincterotomy and after discussing that with the patient and his father including the risks of proceeding with ERCP versus letting him eat and go home and see how he does I believe they would like to proceed with an ERCP and sphincterotomy and the success rate was also discussed and I discussed the case with his surgeon who agrees  [facility],Miller [place] E

## 2014-11-09 ENCOUNTER — Encounter (HOSPITAL_COMMUNITY): Payer: Self-pay | Admitting: Gastroenterology

## 2016-02-08 IMAGING — RF DG CHOLANGIOGRAM OPERATIVE
1 series · 4 of 4 positions shown · non-contrast
Comparison: 11/02/2014

CLINICAL DATA: Intraoperative cholangiogram, history of possible
recently passed common bile duct stones

EXAM:
INTRAOPERATIVE CHOLANGIOGRAM
TECHNIQUE: Cholangiographic images from the C-arm fluoroscopic device were
submitted for interpretation post-operatively. Please see the
procedural report for the amount of contrast and the fluoroscopy
time utilized.

[Series 1: run · 4 of 148 frames shown]
[frame 23/148]
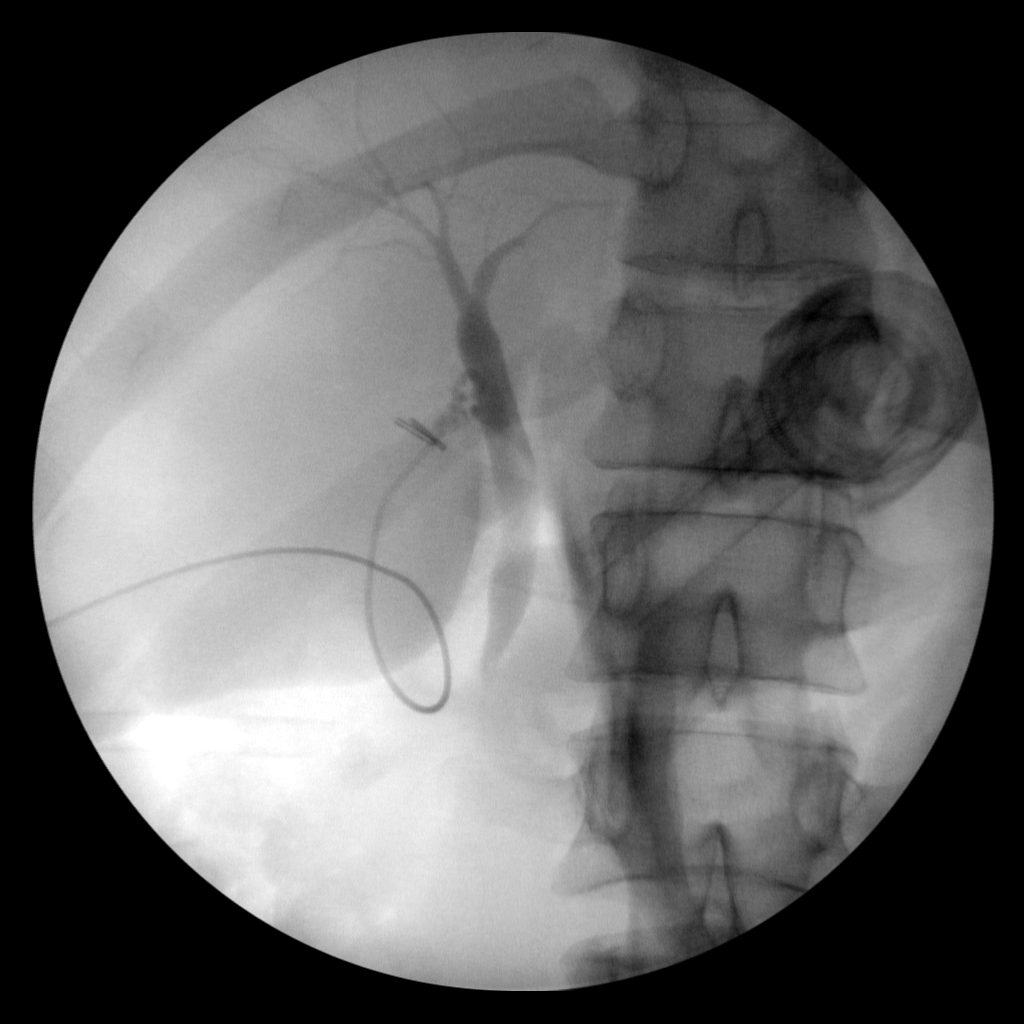
[frame 75/148]
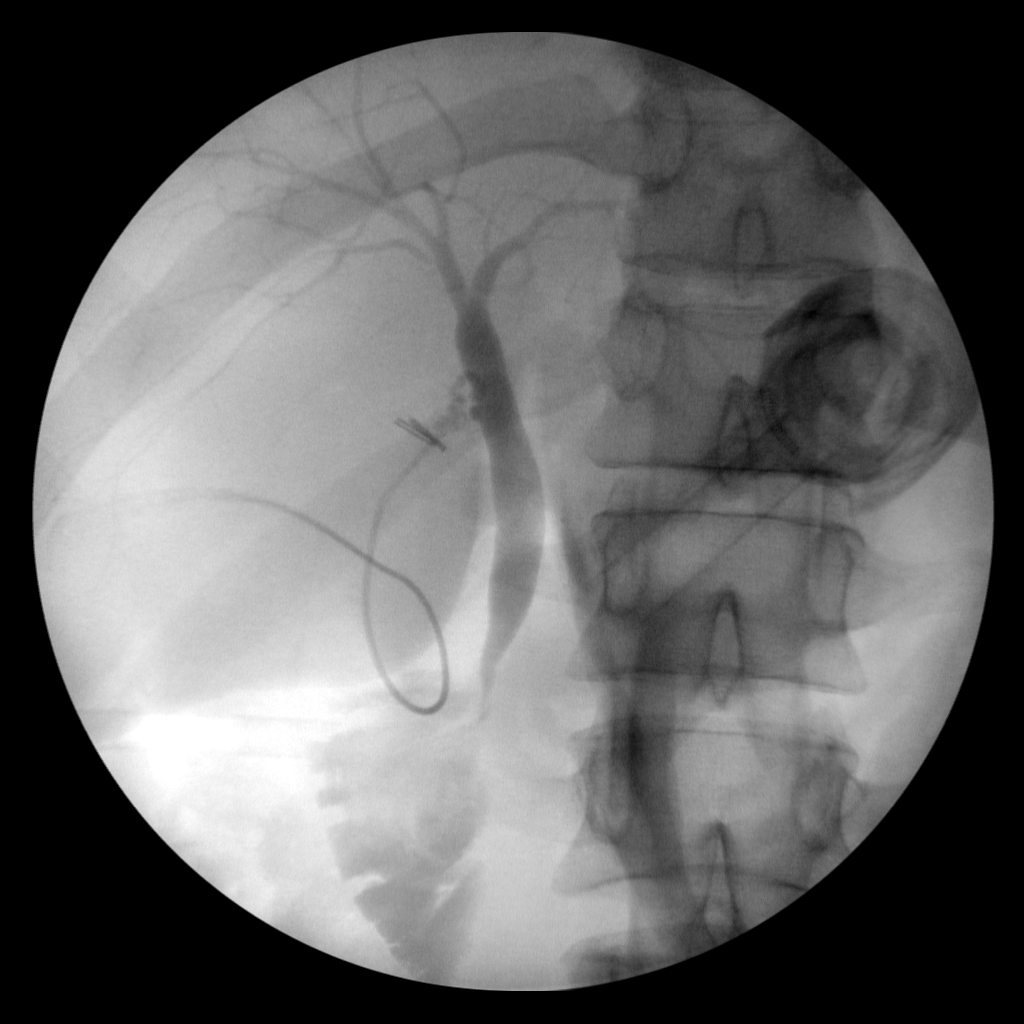
[frame 126/148]
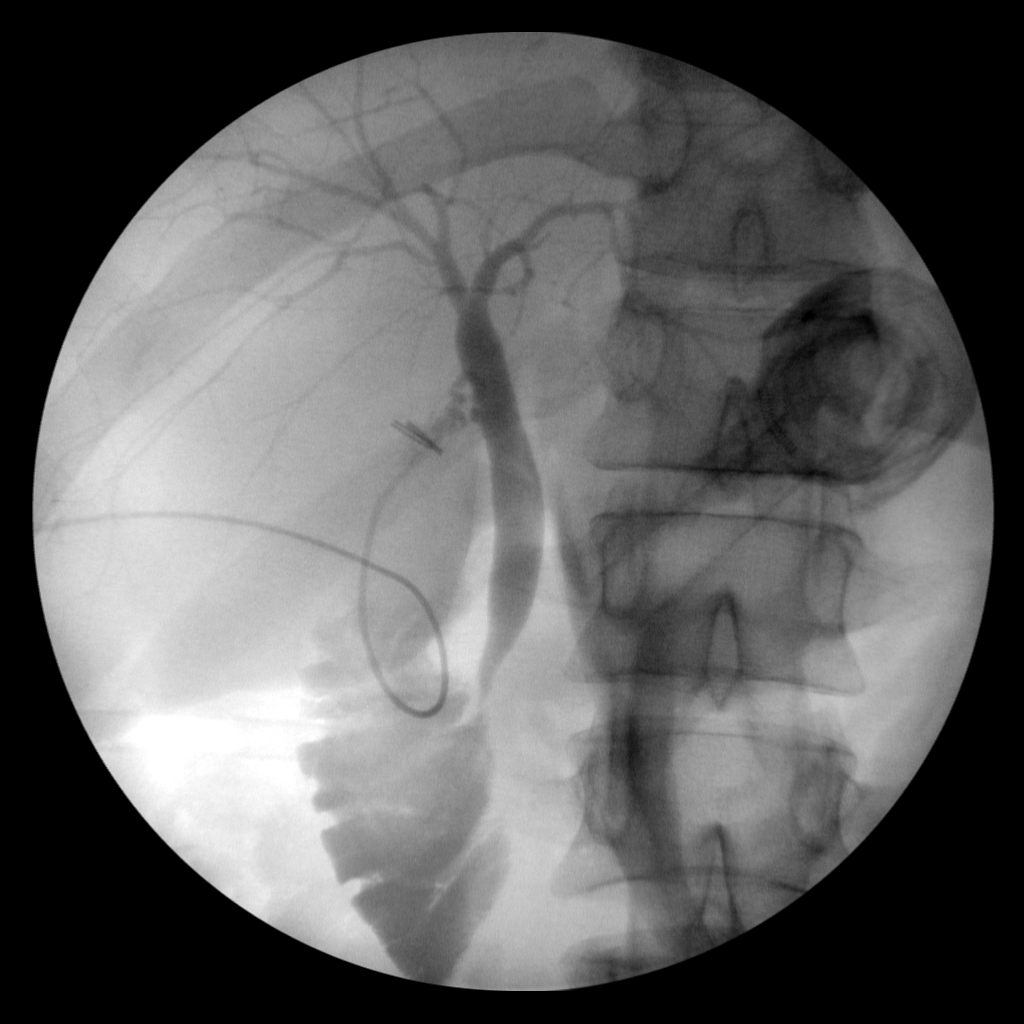
[frame 144/148]
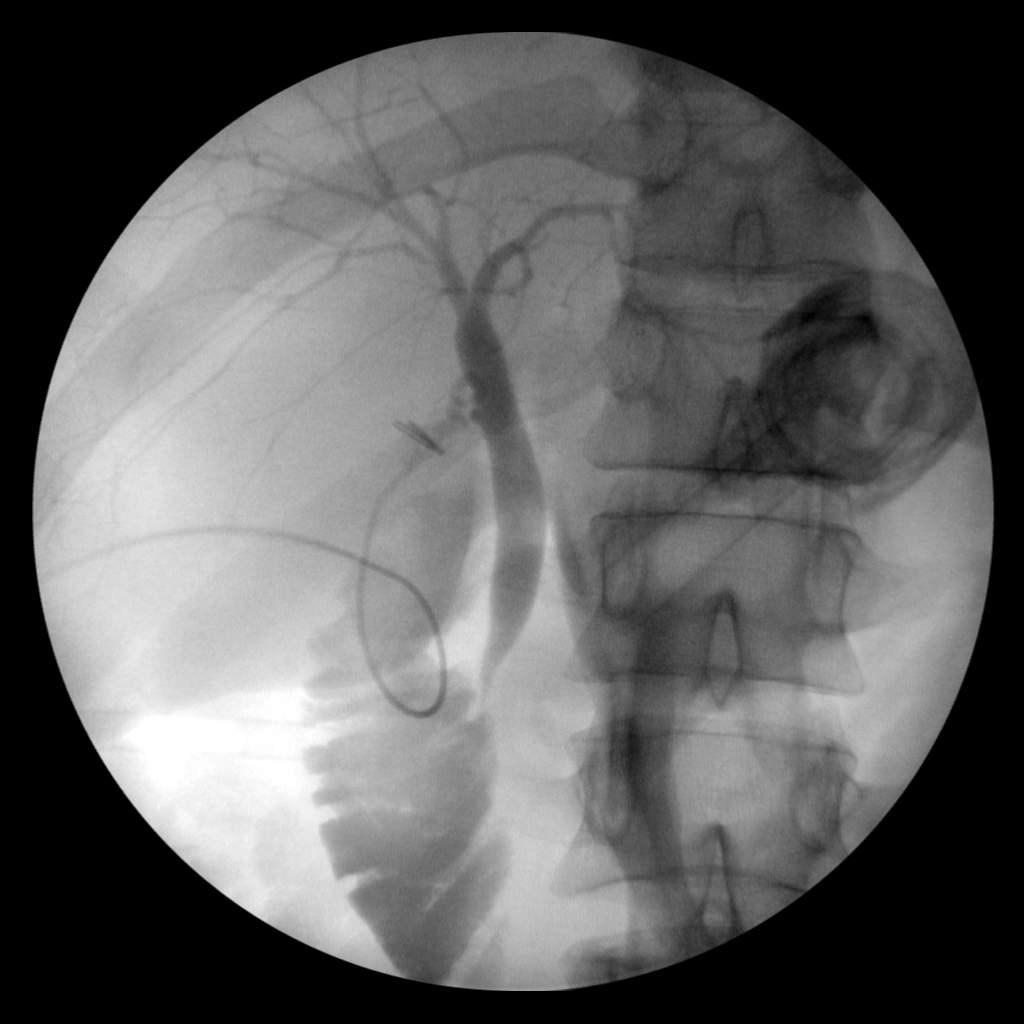

[4 of 4 positions shown; findings below may reference images not displayed]

FINDINGS: Free flow contrast material is noted into the common bile duct
opacification of the intrahepatic and extrahepatic biliary tree is
noted. No filling defects are identified. Free flow contrast into
the duodenum is noted.

FLUOROSCOPY TIME:  21 seconds
IMPRESSION: No evidence of retained common bile duct stone.

These results were called by telephone at the time of interpretation
on 11/05/2014 at [DATE] to Dr. ABDUR ROCHIM AHUSULUDIN , who verbally
acknowledged these results.
